# Patient Record
Sex: Female | Born: 1985 | Smoking: Never smoker
Health system: Southern US, Community
[De-identification: ages and names within clinical notes are randomized; demographics above are authoritative.]

---

## 2020-03-17 ENCOUNTER — Encounter (HOSPITAL_COMMUNITY): Payer: Self-pay

## 2020-03-17 ENCOUNTER — Emergency Department (HOSPITAL_COMMUNITY)
Admission: EM | Admit: 2020-03-17 | Discharge: 2020-03-17 | Disposition: A | Discharge status: Home / Self Care | Payer: HRSA Program | Attending: Emergency Medicine | Admitting: Emergency Medicine

## 2020-03-17 ENCOUNTER — Other Ambulatory Visit: Payer: Self-pay

## 2020-03-17 DIAGNOSIS — M791 Myalgia, unspecified site: Secondary | ICD-10-CM | POA: Insufficient documentation

## 2020-03-17 DIAGNOSIS — R509 Fever, unspecified: Secondary | ICD-10-CM | POA: Insufficient documentation

## 2020-03-17 DIAGNOSIS — Z5321 Procedure and treatment not carried out due to patient leaving prior to being seen by health care provider: Secondary | ICD-10-CM | POA: Insufficient documentation

## 2020-03-17 DIAGNOSIS — R5383 Other fatigue: Secondary | ICD-10-CM | POA: Diagnosis not present

## 2020-03-17 DIAGNOSIS — U071 COVID-19: Secondary | ICD-10-CM | POA: Diagnosis not present

## 2020-03-17 NOTE — ED Notes (Signed)
Pt called 5x no response. Pt moved off the floor

## 2020-03-17 NOTE — ED Triage Notes (Signed)
Patient states that she was diagnosed with Covid yesterday and continuing to have body aches, fever, and fatigue. Alert and oriented, NAD

## 2020-03-23 ENCOUNTER — Emergency Department (HOSPITAL_COMMUNITY)
Admission: EM | Admit: 2020-03-23 | Discharge: 2020-03-24 | Disposition: A | Discharge status: Home / Self Care | Payer: Self-pay | Attending: Emergency Medicine | Admitting: Emergency Medicine

## 2020-03-23 ENCOUNTER — Other Ambulatory Visit: Payer: Self-pay

## 2020-03-23 ENCOUNTER — Encounter (HOSPITAL_COMMUNITY): Payer: Self-pay | Admitting: Emergency Medicine

## 2020-03-23 DIAGNOSIS — R079 Chest pain, unspecified: Secondary | ICD-10-CM

## 2020-03-23 DIAGNOSIS — R112 Nausea with vomiting, unspecified: Secondary | ICD-10-CM | POA: Insufficient documentation

## 2020-03-23 DIAGNOSIS — R0789 Other chest pain: Secondary | ICD-10-CM | POA: Insufficient documentation

## 2020-03-23 DIAGNOSIS — Z8616 Personal history of COVID-19: Secondary | ICD-10-CM | POA: Insufficient documentation

## 2020-03-23 DIAGNOSIS — R05 Cough: Secondary | ICD-10-CM | POA: Insufficient documentation

## 2020-03-23 DIAGNOSIS — R197 Diarrhea, unspecified: Secondary | ICD-10-CM | POA: Insufficient documentation

## 2020-03-23 DIAGNOSIS — R1013 Epigastric pain: Secondary | ICD-10-CM | POA: Insufficient documentation

## 2020-03-23 DIAGNOSIS — R001 Bradycardia, unspecified: Secondary | ICD-10-CM | POA: Insufficient documentation

## 2020-03-23 DIAGNOSIS — R0602 Shortness of breath: Secondary | ICD-10-CM | POA: Insufficient documentation

## 2020-03-23 NOTE — ED Triage Notes (Signed)
Pt reports she was diagnosed covid+ on 8/18. C/o chest pain that started today.

## 2020-03-24 ENCOUNTER — Encounter (HOSPITAL_COMMUNITY): Payer: Self-pay | Admitting: Student

## 2020-03-24 ENCOUNTER — Emergency Department (HOSPITAL_COMMUNITY): Payer: Self-pay

## 2020-03-24 LAB — BASIC METABOLIC PANEL
Anion gap: 7 (ref 5–15)
BUN: 9 mg/dL (ref 6–20)
CO2: 26 mmol/L (ref 22–32)
Calcium: 8.5 mg/dL — ABNORMAL LOW (ref 8.9–10.3)
Chloride: 105 mmol/L (ref 98–111)
Creatinine, Ser: 0.75 mg/dL (ref 0.44–1.00)
GFR calc Af Amer: >60 mL/min (ref >60)
GFR calc non Af Amer: >60 mL/min (ref >60)
Glucose, Bld: 95 mg/dL (ref 70–99)
Potassium: 4.3 mmol/L (ref 3.5–5.1)
Sodium: 138 mmol/L (ref 135–145)

## 2020-03-24 LAB — HEPATIC FUNCTION PANEL
ALT: 25 U/L (ref 0–44)
AST: 21 U/L (ref 15–41)
Albumin: 3.6 g/dL (ref 3.5–5.0)
Alkaline Phosphatase: 45 U/L (ref 38–126)
Bilirubin, Direct: <0.1 mg/dL (ref 0.0–0.2)
Total Bilirubin: 0.2 mg/dL — ABNORMAL LOW (ref 0.3–1.2)
Total Protein: 6.8 g/dL (ref 6.5–8.1)

## 2020-03-24 LAB — CBC
HCT: 40 % (ref 36.0–46.0)
Hemoglobin: 13.5 g/dL (ref 12.0–15.0)
MCH: 33.3 pg (ref 26.0–34.0)
MCHC: 33.8 g/dL (ref 30.0–36.0)
MCV: 98.8 fL (ref 80.0–100.0)
Platelets: 204 10*3/uL (ref 150–400)
RBC: 4.05 MIL/uL (ref 3.87–5.11)
RDW: 11.3 % — ABNORMAL LOW (ref 11.5–15.5)
WBC: 6 10*3/uL (ref 4.0–10.5)
nRBC: 0 % (ref 0.0–0.2)

## 2020-03-24 LAB — TROPONIN I (HIGH SENSITIVITY)
Troponin I (High Sensitivity): <2 ng/L (ref <18)
Troponin I (High Sensitivity): 2 ng/L (ref <18)

## 2020-03-24 LAB — I-STAT BETA HCG BLOOD, ED (MC, WL, AP ONLY): I-stat hCG, quantitative: <5 m[IU]/mL (ref <5)

## 2020-03-24 LAB — LIPASE, BLOOD: Lipase: 25 U/L (ref 11–51)

## 2020-03-24 MED ORDER — SUCRALFATE 1 GM/10ML PO SUSP: 1.0000 g | Freq: Three times a day (TID) | ORAL | 0 refills | Status: AC

## 2020-03-24 MED ORDER — ALUM & MAG HYDROXIDE-SIMETH 200-200-20 MG/5ML PO SUSP
30.0000 mL | Freq: Once | ORAL | Status: AC
  Administered 2020-03-24: 30 mL via ORAL
  Filled 2020-03-24: qty 30

## 2020-03-24 NOTE — ED Provider Notes (Signed)
[facility] EMERGENCY DEPARTMENT Provider Note   CSN: [id number] Arrival date & time: 03/23/20  2336     History Chief Complaint  Patient presents with  . Chest Pain    Cynthia Dougherty is a 34 y.o. female without significant past medical hx who presents to the ED with complaints of chest pain that started last night. Patient states the pain is to her central chest into her upper abdomen/around her ribs. It is intermittent, lasts varying periods of time, feels like a burning pain. No alleviating/aggravating factors or triggers. No change with exertion or a deep breath. She was diagnosed with COVID 03/16/20 and has upper/lower respiratory sxs & GI sxs which seem to be improving, she is still coughing, having some diarrhea, & loss of taste/smell. She denies current fever, vomiting, dyspnea,  leg pain/swelling, hemoptysis, recent surgery/trauma, recent long travel, hormone use, personal hx of cancer, or hx of DVT/PE.   HPI     History reviewed. No pertinent past medical history.  There are no problems to display for this patient.   No past surgical history on file.   OB History   No obstetric history on file.     No family history on file.  Social History   Tobacco Use  . Smoking status: Not on file  Substance Use Topics  . Alcohol use: Not on file  . Drug use: Not on file    Home Medications Prior to Admission medications   Not on File    Allergies    Patient has no known allergies.  Review of Systems   Review of Systems  Constitutional: Negative for chills and fever.  HENT:       Positive for loss of taste/smell.   Respiratory: Positive for cough and shortness of breath (resolved).   Cardiovascular: Positive for chest pain. Negative for leg swelling.  Gastrointestinal: Positive for abdominal pain, diarrhea, nausea and vomiting (resolved). Negative for blood in stool.  Genitourinary: Negative for dysuria.  Neurological: Negative for syncope.    All other systems reviewed and are negative.   Physical Exam Updated Vital Signs BP 101/78 (BP Location: Right Arm)   Pulse (!) 56   Temp 97.7 F (36.5 C) (Oral)   Resp 18   SpO2 97%   Physical Exam Vitals and nursing note reviewed.  Constitutional:      General: She is not in acute distress.    Appearance: She is well-developed. She is not toxic-appearing.  HENT:     Head: Normocephalic and atraumatic.  Eyes:     General:        Right eye: No discharge.        Left eye: No discharge.     Conjunctiva/sclera: Conjunctivae normal.  Cardiovascular:     Rate and Rhythm: Regular rhythm. Bradycardia present.     Pulses:          Radial pulses are 2+ on the right side and 2+ on the left side.  Pulmonary:     Effort: Pulmonary effort is normal. No respiratory distress.     Breath sounds: Normal breath sounds. No wheezing, rhonchi or rales.  Chest:     Chest wall: Tenderness (anterior chest wall without overlying skin changes. ) present.  Abdominal:     General: There is no distension.     Palpations: Abdomen is soft.     Tenderness: There is abdominal tenderness (epigastrium). There is no guarding or rebound.     Comments: Negative murphys.  Musculoskeletal:     Cervical back: Neck supple.     Right lower leg: No tenderness. No edema.     Left lower leg: No tenderness. No edema.  Skin:    General: Skin is warm and dry.     Findings: No rash.  Neurological:     Mental Status: She is alert.     Comments: Clear speech.   Psychiatric:        Behavior: Behavior normal.     ED Results / Procedures / Treatments   Labs (all labs ordered are listed, but only abnormal results are displayed) Labs Reviewed  BASIC METABOLIC PANEL - Abnormal; Notable for the following components:      Result Value   Calcium 8.5 (*)    All other components within normal limits  CBC - Abnormal; Notable for the following components:   RDW 11.3 (*)    All other components within normal limits   HEPATIC FUNCTION PANEL - Abnormal; Notable for the following components:   Total Bilirubin 0.2 (*)    All other components within normal limits  LIPASE, BLOOD  I-STAT BETA HCG BLOOD, ED ([facility], [facility], [facility] ONLY)  TROPONIN I (HIGH SENSITIVITY)  TROPONIN I (HIGH SENSITIVITY)    EKG EKG Interpretation  Date/Time:  Wednesday March 23 2020 23:47:24 EDT Ventricular Rate:  64 PR Interval:  140 QRS Duration: 82 QT Interval:  424 QTC Calculation: 437 R Axis:   71 Text Interpretation: Normal sinus rhythm with sinus arrhythmia Normal ECG non specific st changes in lead III Otherwise no significant change Confirmed by Melene Plan [phone]) on 03/24/2020 8:00:25 AM   Radiology DG Chest Portable 1 View  Result Date: 03/24/2020 CLINICAL DATA:  Chest pain.  Recent COVID diagnosis. EXAM: PORTABLE CHEST 1 VIEW COMPARISON:  None. FINDINGS: The heart size and mediastinal contours are within normal limits. Both lungs are clear. The visualized skeletal structures are unremarkable. IMPRESSION: Negative chest. Electronically Signed   By: Marnee Spring M.D.   On: 03/24/2020 08:40    Procedures Procedures (including critical care time)  Medications Ordered in ED Medications  alum & mag hydroxide-simeth (MAALOX/MYLANTA) 200-200-20 MG/5ML suspension 30 mL (30 mLs Oral Given 03/24/20 1448)    ED Course  I have reviewed the triage vital signs and the nursing notes.  Pertinent labs & imaging results that were available during my care of the patient were reviewed by me and considered in my medical decision making (see chart for details).  Cynthia Dougherty was evaluated in Emergency Department on 03/24/2020 for the symptoms described in the history of present illness. He/she was evaluated in the context of the global COVID-19 pandemic, which necessitated consideration that the patient might be at risk for infection with the SARS-CoV-2 virus that causes COVID-19. Institutional protocols and algorithms that pertain to the  evaluation of patients at risk for COVID-19 are in a state of rapid change based on information released by regulatory bodies including the CDC and federal and state organizations. These policies and algorithms were followed during the patient's care in the ED.    MDM Rules/Calculators/A&P                         Patient presents to the ED with complaints of chest pain, positive for COVID 19 03/16/20.  She is nontoxic, resting comfortably, mildly bradycardic, vitals otherwise fairly unremarkable. Chest pain somewhat reproducible with chest wall & epigastric palpation. No peritoneal signs on abdominal exam  DDX ACS, PE, dissection, pneumothorax, focal pneumonia, CHF, pleurisy, MSK, GERD, intra-abdominal pathology.   Additional history obtained:  Additional history obtained from chart review & nursing note review.  EKG: Normal sinus rhythm with sinus arrhythmia Normal ECG non specific st changes in lead III Otherwise no significant change  Lab Tests:  I reviewed and interpreted labs, which included:  CBC, BMP, preg test, troponin--> fairly unremarkable, no significant troponin elevation.  I ordered hepatic function panel & lipase- no significant abnormalities noted.   Imaging Studies ordered:  I ordered imaging studies which included CXR, I independently visualized and interpreted imaging which showed no acute cardiopulmonary disease.   HEAR score low risk, troponins flat- doubt ACS.  Patient is low risk Wells, PERC negative, doubt pulmonary embolism.  She has no widened mediastinum on chest x-ray, symmetric pulses, low suspicion for dissection.  She has no findings of pneumonia, CHF, or pneumothorax on chest x-ray either.  Her labs are reassuring.  Repeat abdominal exam remains without peritoneal signs. She is chest pain free on re-assessment.  Unclear definitive etiology to her chest discomfort, she is tolerating p.o., suspect pleuritic versus MSK versus GERD.  Will discharge home with  Carafate, recommended tylenol per OTC dosing, and PCP follow-up. I discussed results, treatment plan, need for follow-up, and return precautions with the patient. Provided opportunity for questions, patient confirmed understanding and is in agreement with plan.   Portions of this note were generated with Scientist, clinical (histocompatibility and immunogenetics). Dictation errors may occur despite best attempts at proofreading.  Final Clinical Impression(s) / ED Diagnoses Final diagnoses:  Chest pain, unspecified type    Rx / DC Orders ED Discharge Orders         Ordered    sucralfate (CARAFATE) 1 GM/10ML suspension  3 times daily with meals & bedtime        03/24/20 [address] [place], PA-C 03/24/20 1059    [place], DO 03/24/20 1100

## 2020-03-24 NOTE — ED Notes (Signed)
NA X2 

## 2020-03-24 NOTE — ED Notes (Signed)
Main lab to add on HFP and lipase 

## 2020-03-24 NOTE — Discharge Instructions (Addendum)
You were seen in the emergency department today for chest pain.  Your blood work is overall reassuring.  Your chest x-ray was normal.  We are sending you home with a medicine to help with possible stomach irritation given your pain is a burning type discomfort, please take this prior to meals and prior to bedtime to assist with this.  You may also try taking Tylenol per over-the-counter dosing as needed.  Please follow-up with your primary provider within 1 week for reevaluation.  Return to the ER for new or worsening symptoms including but not limited to worsening pain, trouble breathing, passing out, inability to keep fluids down, fever, or any other concerns.`

## 2020-03-24 NOTE — ED Notes (Signed)
Cynthia Dougherty called requesting that we take her out of our system.

## 2020-10-24 ENCOUNTER — Other Ambulatory Visit: Payer: Self-pay

## 2020-10-24 ENCOUNTER — Encounter (HOSPITAL_COMMUNITY): Payer: Self-pay | Admitting: Emergency Medicine

## 2020-10-24 ENCOUNTER — Emergency Department (HOSPITAL_COMMUNITY)
Admission: EM | Admit: 2020-10-24 | Discharge: 2020-10-25 | Disposition: A | Discharge status: Home / Self Care | Payer: Self-pay | Attending: Emergency Medicine | Admitting: Emergency Medicine

## 2020-10-24 DIAGNOSIS — R5383 Other fatigue: Secondary | ICD-10-CM | POA: Insufficient documentation

## 2020-10-24 DIAGNOSIS — R110 Nausea: Secondary | ICD-10-CM | POA: Insufficient documentation

## 2020-10-24 DIAGNOSIS — Z5321 Procedure and treatment not carried out due to patient leaving prior to being seen by health care provider: Secondary | ICD-10-CM | POA: Insufficient documentation

## 2020-10-24 DIAGNOSIS — R42 Dizziness and giddiness: Secondary | ICD-10-CM | POA: Insufficient documentation

## 2020-10-24 DIAGNOSIS — R0602 Shortness of breath: Secondary | ICD-10-CM | POA: Insufficient documentation

## 2020-10-24 DIAGNOSIS — R079 Chest pain, unspecified: Secondary | ICD-10-CM | POA: Insufficient documentation

## 2020-10-24 NOTE — ED Triage Notes (Signed)
Patient reports pain at middle of chest with mild SOB and nausea/fatigue this evening , no emesis or diaphoresis , denies cough or fever .

## 2020-10-25 ENCOUNTER — Emergency Department (HOSPITAL_COMMUNITY): Payer: Self-pay

## 2020-10-25 LAB — BASIC METABOLIC PANEL
Anion gap: 5 (ref 5–15)
BUN: 15 mg/dL (ref 6–20)
CO2: 28 mmol/L (ref 22–32)
Calcium: 8.8 mg/dL — ABNORMAL LOW (ref 8.9–10.3)
Chloride: 106 mmol/L (ref 98–111)
Creatinine, Ser: 0.66 mg/dL (ref 0.44–1.00)
GFR, Estimated: >60 mL/min (ref >60)
Glucose, Bld: 106 mg/dL — ABNORMAL HIGH (ref 70–99)
Potassium: 3.7 mmol/L (ref 3.5–5.1)
Sodium: 139 mmol/L (ref 135–145)

## 2020-10-25 LAB — I-STAT BETA HCG BLOOD, ED (MC, WL, AP ONLY): I-stat hCG, quantitative: <5 m[IU]/mL (ref <5)

## 2020-10-25 LAB — CBC
HCT: 37.5 % (ref 36.0–46.0)
Hemoglobin: 12.4 g/dL (ref 12.0–15.0)
MCH: 33.1 pg (ref 26.0–34.0)
MCHC: 33.1 g/dL (ref 30.0–36.0)
MCV: 100 fL (ref 80.0–100.0)
Platelets: 227 10*3/uL (ref 150–400)
RBC: 3.75 MIL/uL — ABNORMAL LOW (ref 3.87–5.11)
RDW: 11.9 % (ref 11.5–15.5)
WBC: 3.7 10*3/uL — ABNORMAL LOW (ref 4.0–10.5)
nRBC: 0 % (ref 0.0–0.2)

## 2020-10-25 LAB — TROPONIN I (HIGH SENSITIVITY): Troponin I (High Sensitivity): <2 ng/L (ref <18)

## 2020-10-25 NOTE — ED Notes (Signed)
NA for roll call  

## 2021-03-02 IMAGING — DX DG CHEST 1V PORT
1 series · 1 of 1 positions shown · non-contrast
Comparison: None.

CLINICAL DATA: Chest pain.  Recent COVID diagnosis.

EXAM:
PORTABLE CHEST 1 VIEW

[chest ap]
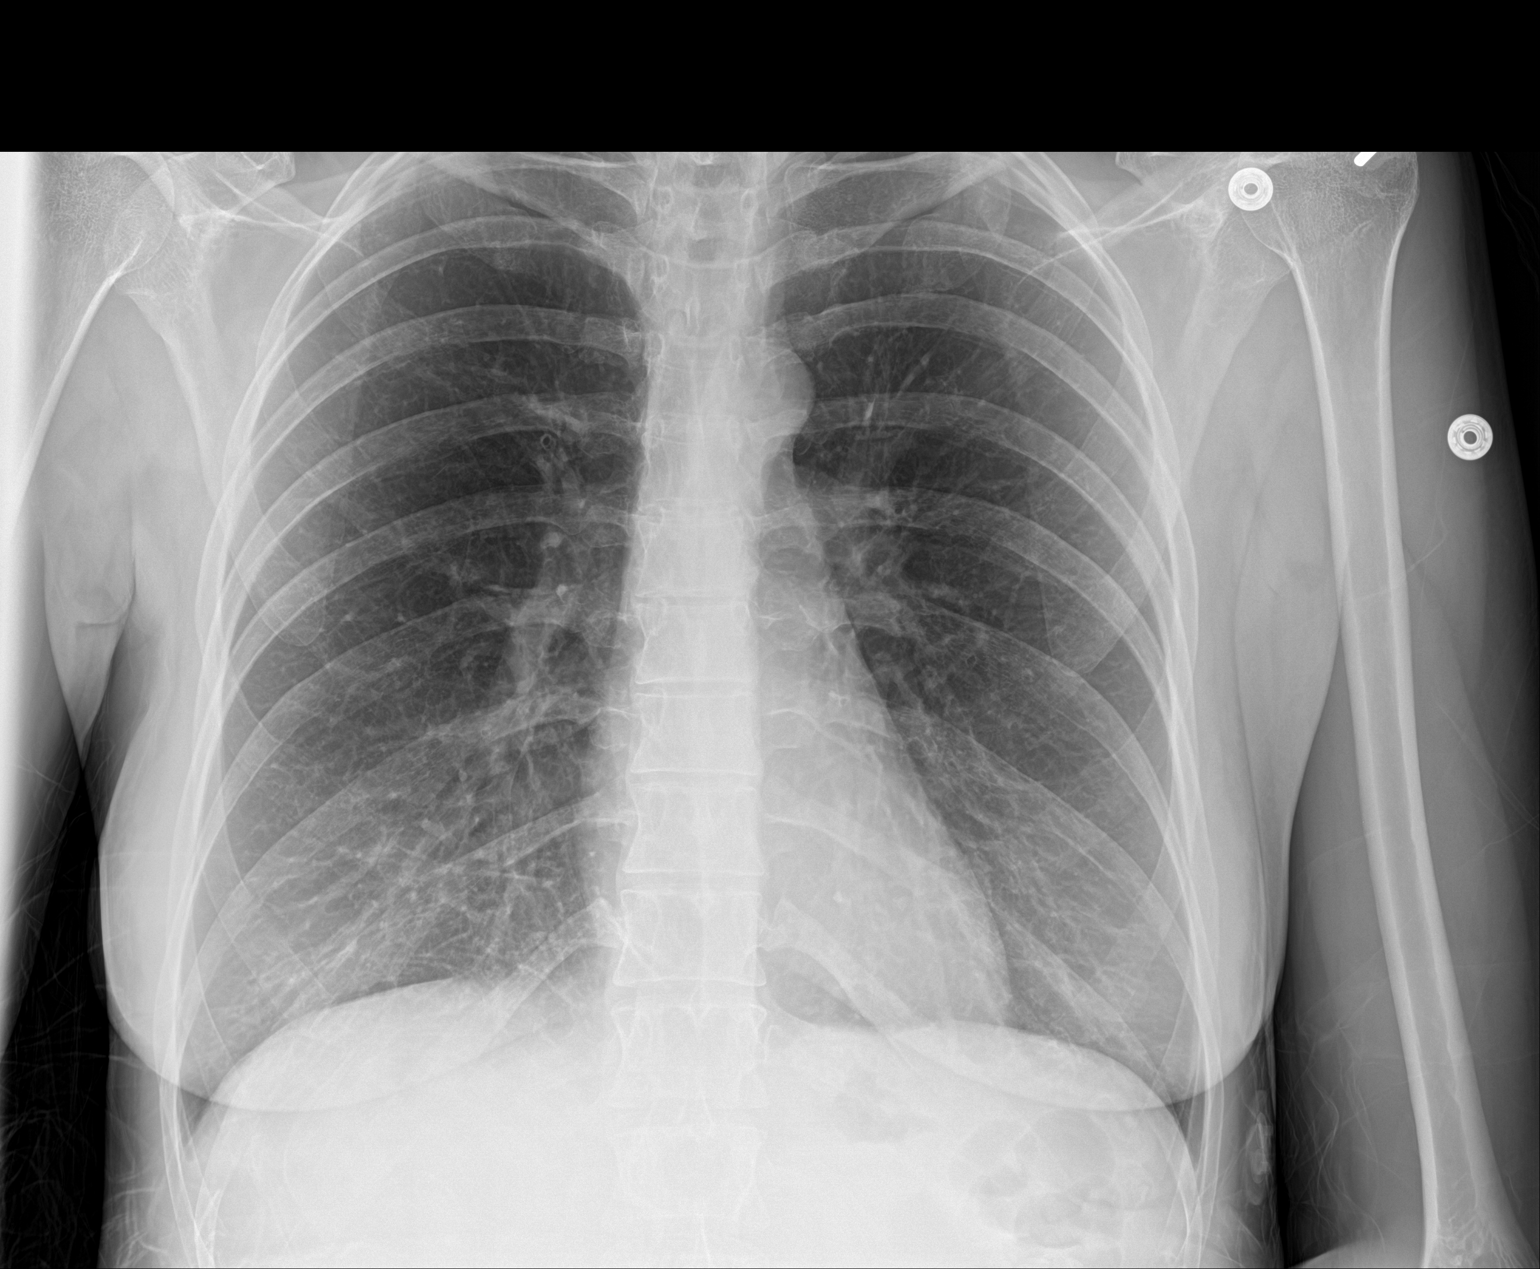

[1 of 1 positions shown; findings below may reference images not displayed]

FINDINGS: The heart size and mediastinal contours are within normal limits.
Both lungs are clear. The visualized skeletal structures are
unremarkable.
IMPRESSION: Negative chest.

## 2021-10-03 IMAGING — CR DG CHEST 2V
2 series · 2 of 2 positions shown · non-contrast
Comparison: 03/24/2020

CLINICAL DATA: Chest pain

EXAM:
CHEST - 2 VIEW

[chest pa]
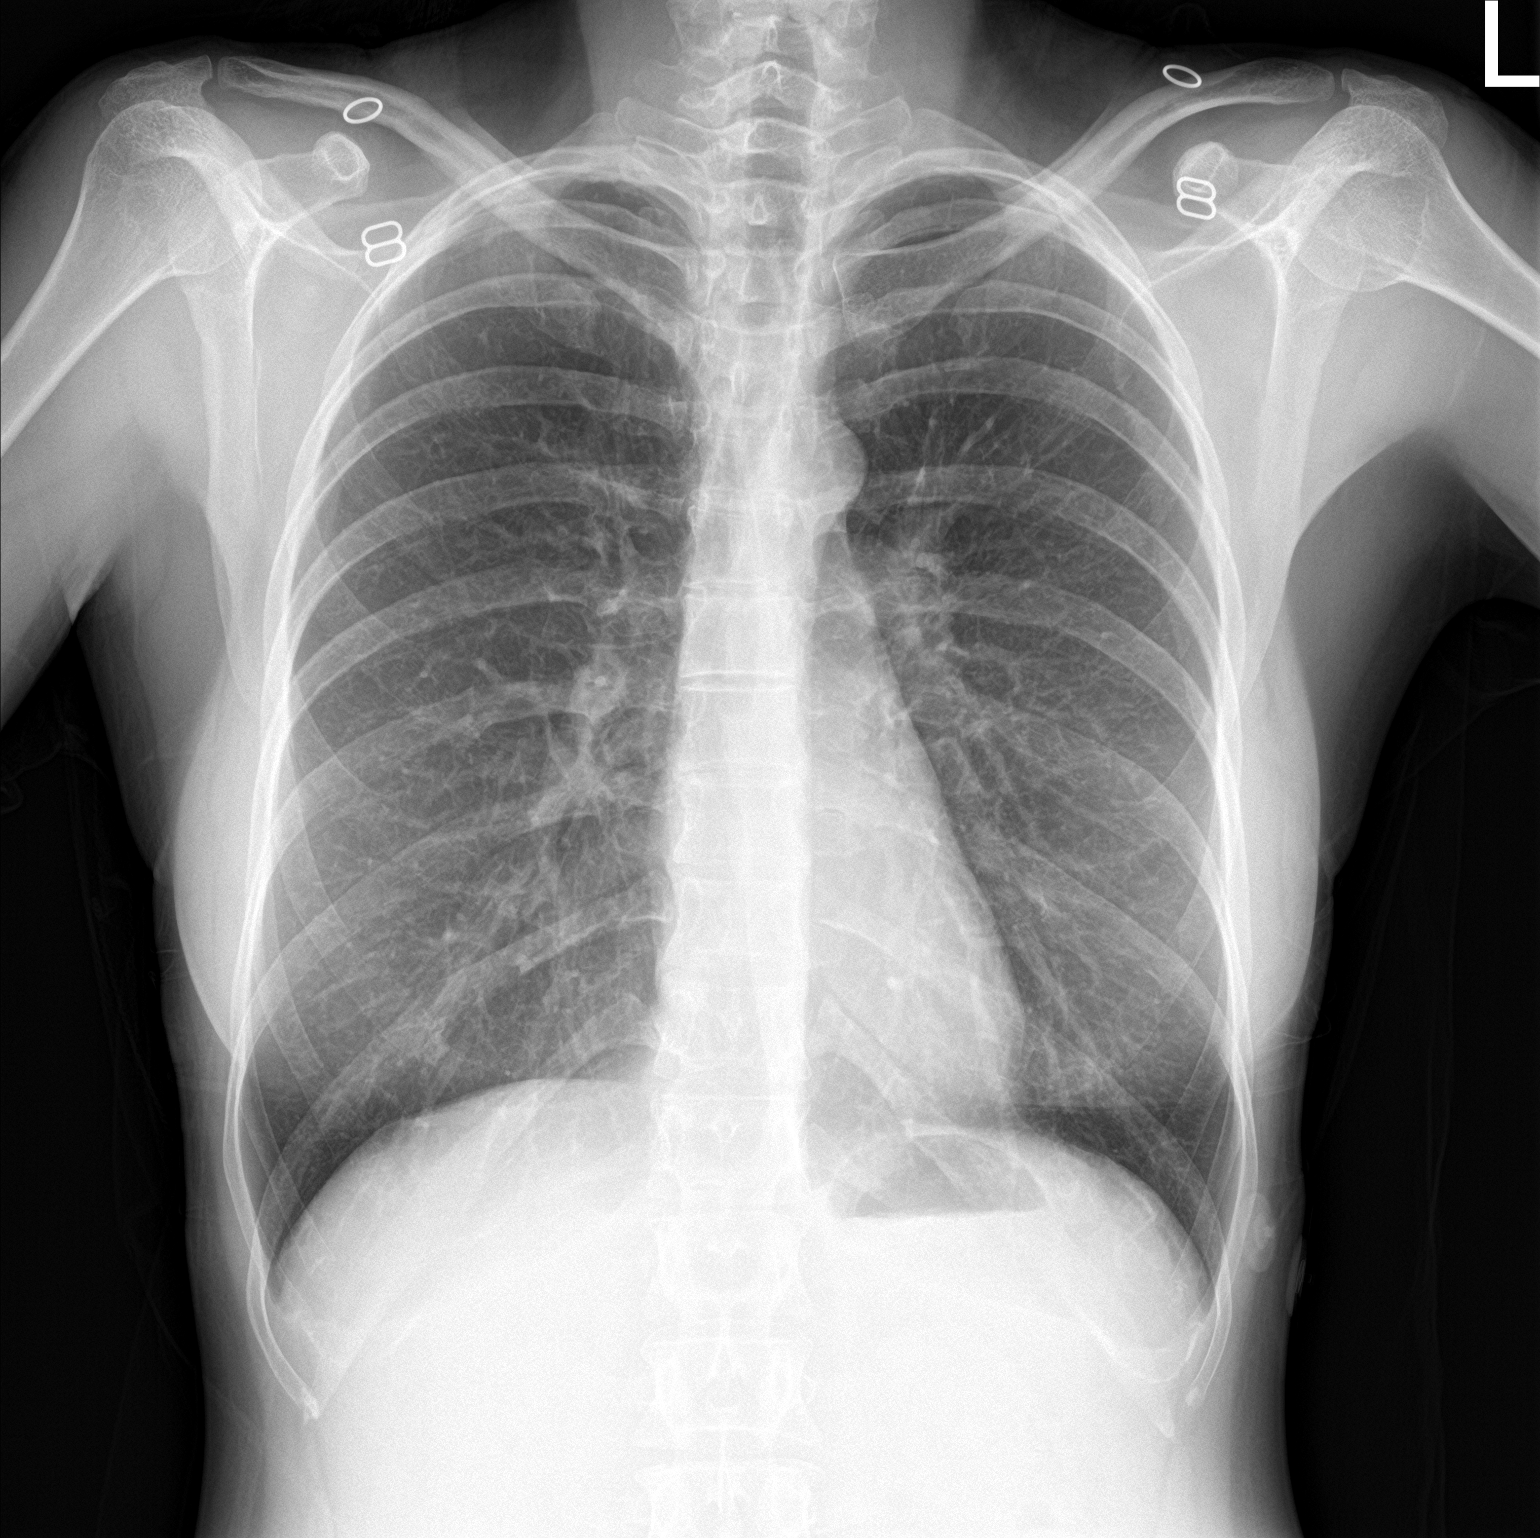

[chest lat]
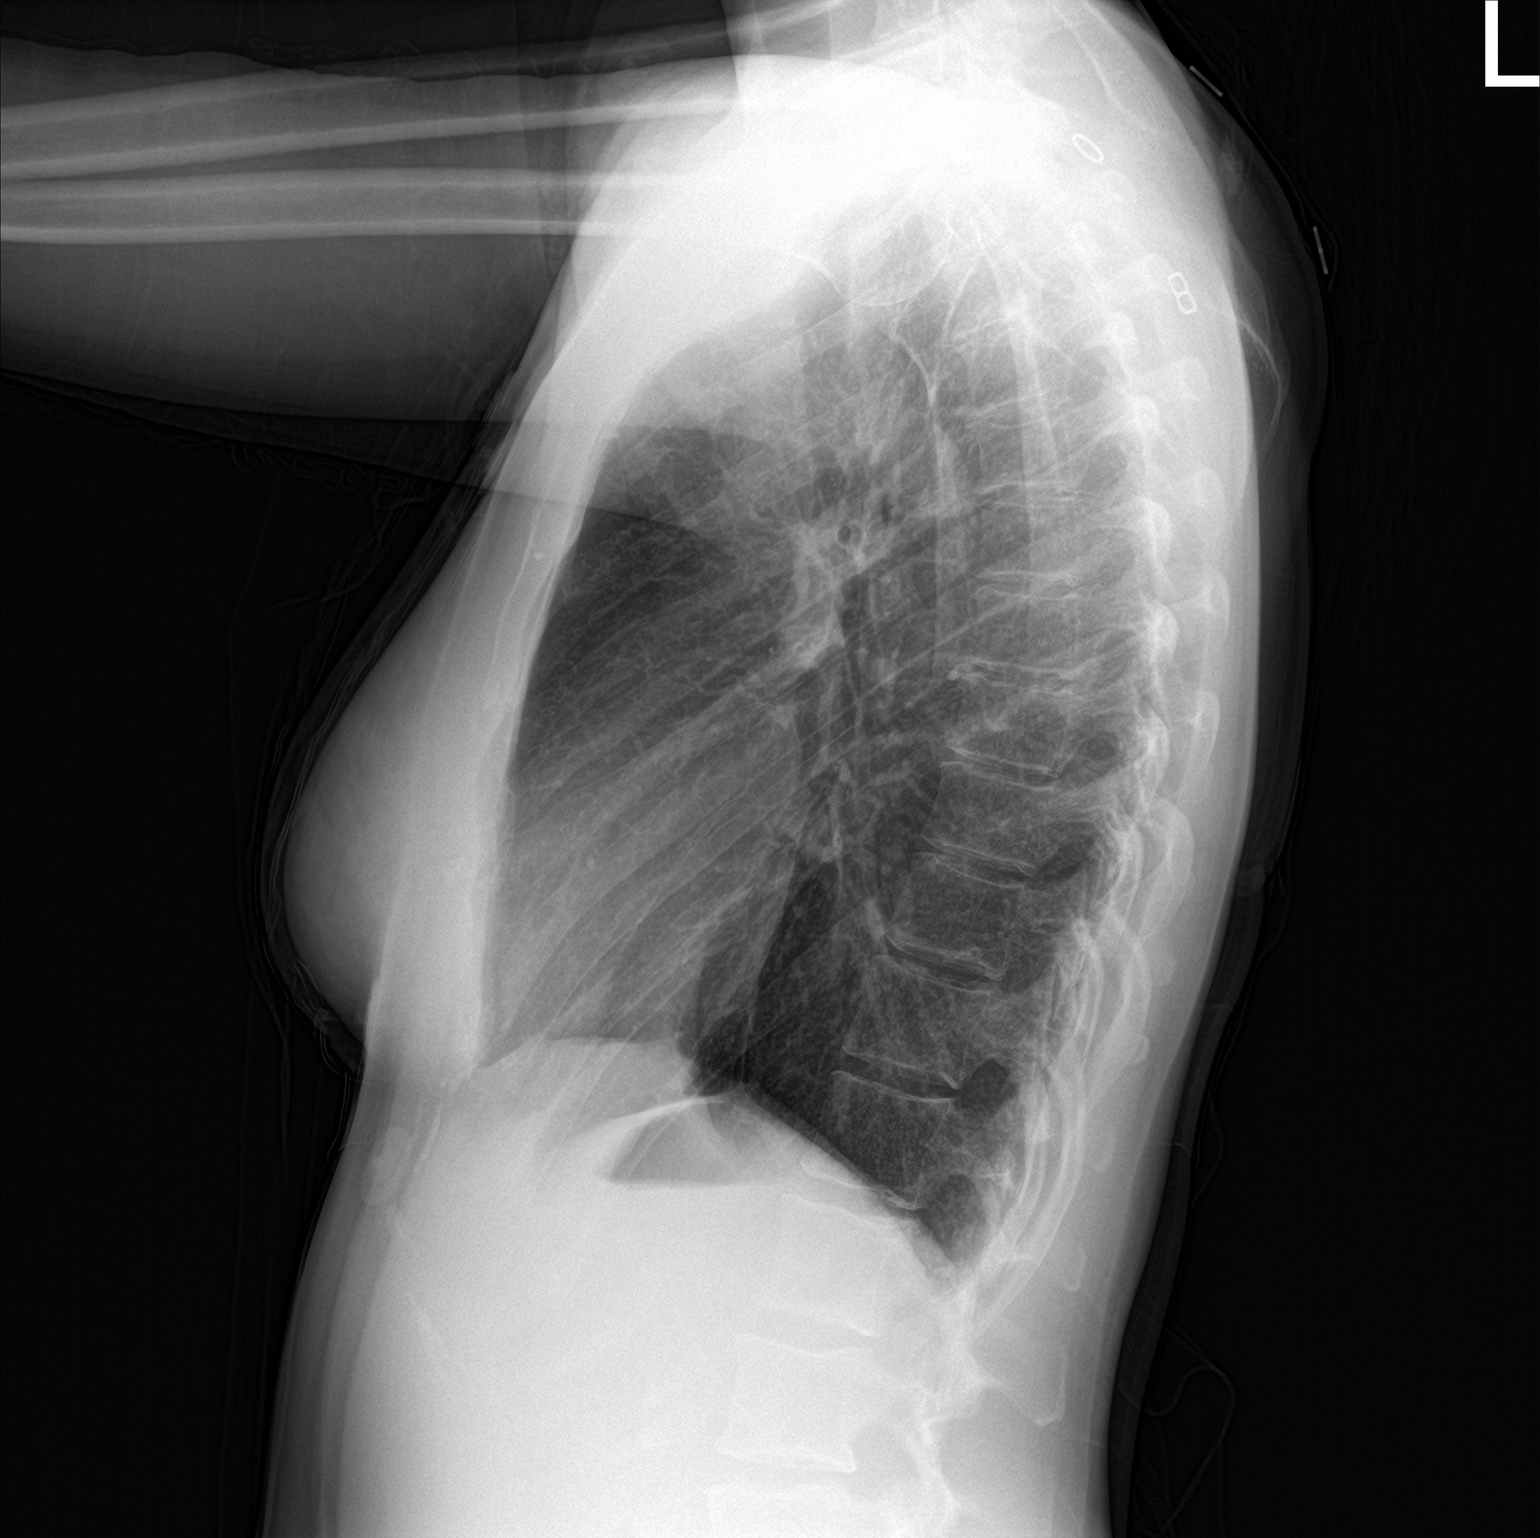

[2 of 2 positions shown; findings below may reference images not displayed]

FINDINGS: The heart size and mediastinal contours are within normal limits.
Both lungs are clear. The visualized skeletal structures are
unremarkable.
IMPRESSION: No active cardiopulmonary disease.
